# Patient Record
Sex: Female | Born: 1955 | Race: White | Hispanic: No | Marital: Married | Smoking: Never smoker
Health system: Southern US, Community
[De-identification: ages and names within clinical notes are randomized; demographics above are authoritative.]

## PROBLEM LIST (undated history)

## (undated) DIAGNOSIS — G43909 Migraine, unspecified, not intractable, without status migrainosus: Secondary | ICD-10-CM

## (undated) DIAGNOSIS — I10 Essential (primary) hypertension: Secondary | ICD-10-CM

## (undated) DIAGNOSIS — M549 Dorsalgia, unspecified: Secondary | ICD-10-CM

## (undated) HISTORY — PX: KNEE SURGERY: SHX244

---

## 2008-12-05 ENCOUNTER — Encounter: Admission: RE | Admit: 2008-12-05 | Discharge: 2009-01-23 | Payer: Self-pay | Admitting: Orthopedic Surgery

## 2010-08-17 ENCOUNTER — Ambulatory Visit: Payer: Self-pay | Admitting: Interventional Radiology

## 2010-08-17 ENCOUNTER — Emergency Department (HOSPITAL_BASED_OUTPATIENT_CLINIC_OR_DEPARTMENT_OTHER): Admission: EM | Admit: 2010-08-17 | Discharge: 2010-08-17 | Payer: Self-pay | Admitting: Emergency Medicine

## 2010-08-29 ENCOUNTER — Encounter
Admission: RE | Admit: 2010-08-29 | Discharge: 2010-08-29 | Payer: Self-pay | Source: Home / Self Care | Admitting: Orthopedic Surgery

## 2010-08-30 ENCOUNTER — Encounter
Admission: RE | Admit: 2010-08-30 | Discharge: 2010-09-20 | Payer: Self-pay | Source: Home / Self Care | Attending: Orthopedic Surgery | Admitting: Orthopedic Surgery

## 2010-09-26 ENCOUNTER — Encounter
Admission: RE | Admit: 2010-09-26 | Discharge: 2010-10-19 | Payer: Self-pay | Source: Home / Self Care | Attending: Orthopedic Surgery | Admitting: Orthopedic Surgery

## 2010-10-17 ENCOUNTER — Encounter: Admit: 2010-10-17 | Payer: Self-pay | Admitting: Orthopedic Surgery

## 2010-10-24 ENCOUNTER — Ambulatory Visit: Payer: 59 | Attending: Orthopedic Surgery | Admitting: Physical Therapy

## 2010-10-24 DIAGNOSIS — M545 Low back pain, unspecified: Secondary | ICD-10-CM | POA: Insufficient documentation

## 2010-10-24 DIAGNOSIS — M2569 Stiffness of other specified joint, not elsewhere classified: Secondary | ICD-10-CM | POA: Insufficient documentation

## 2010-10-24 DIAGNOSIS — IMO0001 Reserved for inherently not codable concepts without codable children: Secondary | ICD-10-CM | POA: Insufficient documentation

## 2010-10-31 ENCOUNTER — Ambulatory Visit: Payer: 59 | Admitting: Rehabilitation

## 2010-11-02 ENCOUNTER — Ambulatory Visit: Payer: 59 | Admitting: Rehabilitation

## 2010-11-07 ENCOUNTER — Ambulatory Visit: Payer: 59 | Admitting: Rehabilitation

## 2010-11-14 ENCOUNTER — Ambulatory Visit: Payer: 59 | Admitting: Rehabilitation

## 2010-11-16 ENCOUNTER — Ambulatory Visit: Payer: 59 | Admitting: Rehabilitation

## 2010-11-21 ENCOUNTER — Ambulatory Visit: Payer: 59 | Admitting: Rehabilitation

## 2010-11-28 ENCOUNTER — Ambulatory Visit: Payer: 59 | Attending: Orthopedic Surgery | Admitting: Physical Therapy

## 2010-11-28 DIAGNOSIS — M545 Low back pain, unspecified: Secondary | ICD-10-CM | POA: Insufficient documentation

## 2010-11-28 DIAGNOSIS — IMO0001 Reserved for inherently not codable concepts without codable children: Secondary | ICD-10-CM | POA: Insufficient documentation

## 2010-11-28 DIAGNOSIS — M2569 Stiffness of other specified joint, not elsewhere classified: Secondary | ICD-10-CM | POA: Insufficient documentation

## 2010-11-30 ENCOUNTER — Ambulatory Visit: Payer: 59 | Admitting: Physical Therapy

## 2010-12-05 ENCOUNTER — Ambulatory Visit: Payer: 59 | Admitting: Rehabilitation

## 2010-12-07 ENCOUNTER — Ambulatory Visit: Payer: 59 | Admitting: Physical Therapy

## 2010-12-12 ENCOUNTER — Ambulatory Visit: Payer: 59

## 2010-12-14 ENCOUNTER — Ambulatory Visit: Payer: 59 | Admitting: Physical Therapy

## 2010-12-19 ENCOUNTER — Ambulatory Visit: Payer: 59 | Admitting: Physical Therapy

## 2010-12-21 ENCOUNTER — Ambulatory Visit: Payer: 59 | Admitting: Physical Therapy

## 2010-12-26 ENCOUNTER — Ambulatory Visit: Payer: 59 | Attending: Orthopedic Surgery | Admitting: Rehabilitation

## 2010-12-26 DIAGNOSIS — M2569 Stiffness of other specified joint, not elsewhere classified: Secondary | ICD-10-CM | POA: Insufficient documentation

## 2010-12-26 DIAGNOSIS — M545 Low back pain, unspecified: Secondary | ICD-10-CM | POA: Insufficient documentation

## 2010-12-26 DIAGNOSIS — IMO0001 Reserved for inherently not codable concepts without codable children: Secondary | ICD-10-CM | POA: Insufficient documentation

## 2011-01-02 ENCOUNTER — Encounter: Payer: 59 | Admitting: Rehabilitation

## 2011-01-04 ENCOUNTER — Ambulatory Visit: Payer: 59 | Admitting: Rehabilitation

## 2011-01-09 ENCOUNTER — Ambulatory Visit: Payer: 59 | Admitting: Rehabilitation

## 2011-01-11 ENCOUNTER — Ambulatory Visit: Payer: 59 | Admitting: Rehabilitation

## 2011-01-16 ENCOUNTER — Encounter: Payer: 59 | Admitting: Rehabilitation

## 2011-01-18 ENCOUNTER — Encounter: Payer: 59 | Admitting: Rehabilitation

## 2015-05-27 ENCOUNTER — Encounter (HOSPITAL_BASED_OUTPATIENT_CLINIC_OR_DEPARTMENT_OTHER): Payer: Self-pay | Admitting: Emergency Medicine

## 2015-05-27 ENCOUNTER — Emergency Department (HOSPITAL_BASED_OUTPATIENT_CLINIC_OR_DEPARTMENT_OTHER): Payer: Managed Care, Other (non HMO)

## 2015-05-27 ENCOUNTER — Emergency Department (HOSPITAL_BASED_OUTPATIENT_CLINIC_OR_DEPARTMENT_OTHER)
Admission: EM | Admit: 2015-05-27 | Discharge: 2015-05-27 | Disposition: A | Discharge status: Home / Self Care | Payer: Managed Care, Other (non HMO) | Attending: Emergency Medicine | Admitting: Emergency Medicine

## 2015-05-27 DIAGNOSIS — Z79899 Other long term (current) drug therapy: Secondary | ICD-10-CM | POA: Insufficient documentation

## 2015-05-27 DIAGNOSIS — Y929 Unspecified place or not applicable: Secondary | ICD-10-CM | POA: Diagnosis not present

## 2015-05-27 DIAGNOSIS — Z23 Encounter for immunization: Secondary | ICD-10-CM | POA: Diagnosis not present

## 2015-05-27 DIAGNOSIS — Y998 Other external cause status: Secondary | ICD-10-CM | POA: Diagnosis not present

## 2015-05-27 DIAGNOSIS — I10 Essential (primary) hypertension: Secondary | ICD-10-CM | POA: Insufficient documentation

## 2015-05-27 DIAGNOSIS — Y9389 Activity, other specified: Secondary | ICD-10-CM | POA: Insufficient documentation

## 2015-05-27 DIAGNOSIS — W228XXA Striking against or struck by other objects, initial encounter: Secondary | ICD-10-CM | POA: Insufficient documentation

## 2015-05-27 DIAGNOSIS — S91332A Puncture wound without foreign body, left foot, initial encounter: Secondary | ICD-10-CM | POA: Insufficient documentation

## 2015-05-27 DIAGNOSIS — T148XXA Other injury of unspecified body region, initial encounter: Secondary | ICD-10-CM

## 2015-05-27 HISTORY — DX: Dorsalgia, unspecified: M54.9

## 2015-05-27 HISTORY — DX: Migraine, unspecified, not intractable, without status migrainosus: G43.909

## 2015-05-27 HISTORY — DX: Essential (primary) hypertension: I10

## 2015-05-27 MED ORDER — LIDOCAINE-EPINEPHRINE 1 %-1:100000 IJ SOLN
10.0000 mL | Freq: Once | INTRAMUSCULAR | Status: AC
  Administered 2015-05-27: 10 mL
  Filled 2015-05-27: qty 1

## 2015-05-27 MED ORDER — SULFAMETHOXAZOLE-TRIMETHOPRIM 800-160 MG PO TABS: 1.0000 | ORAL_TABLET | Freq: Two times a day (BID) | ORAL | Status: AC

## 2015-05-27 MED ORDER — NAPROXEN 500 MG PO TABS: 500.0000 mg | ORAL_TABLET | Freq: Two times a day (BID) | ORAL | Status: AC

## 2015-05-27 MED ORDER — HYDROCODONE-ACETAMINOPHEN 5-325 MG PO TABS
2.0000 | ORAL_TABLET | Freq: Once | ORAL | Status: AC
  Administered 2015-05-27: 2 via ORAL
  Filled 2015-05-27: qty 2

## 2015-05-27 MED ORDER — TETANUS-DIPHTH-ACELL PERTUSSIS 5-2.5-18.5 LF-MCG/0.5 IM SUSP
0.5000 mL | Freq: Once | INTRAMUSCULAR | Status: AC
  Administered 2015-05-27: 0.5 mL via INTRAMUSCULAR
  Filled 2015-05-27: qty 0.5

## 2015-05-27 MED ORDER — BACITRACIN ZINC 500 UNIT/GM EX OINT
1.0000 "application " | TOPICAL_OINTMENT | Freq: Two times a day (BID) | CUTANEOUS | Status: DC
  Administered 2015-05-27: 1 via TOPICAL
  Filled 2015-05-27: qty 28.35

## 2015-05-27 NOTE — ED Provider Notes (Signed)
CSN: [id number]     Arrival date & time 05/27/15  1906 History  This chart was scribed for Eber Hong, MD by Doreatha Martin, ED Scribe. This patient was seen in room MH04/MH04 and the patient's care was started at 7:23 PM.     Chief Complaint  Patient presents with  . Puncture Wound   The history is provided by the patient. No language interpreter was used.    HPI Comments: Olivia Solomon is a 59 y.o. female who presents to the Emergency Department complaining of a puncture wound to the plantar aspect of the left foot with controlled bleeding that occurred tonight after stepping on a freshly cut stump of a small tree. Last tetanus>5 yrs ago. She denies numbness, foreign body, fall, LOC, head injury.     Past Medical History  Diagnosis Date  . Back pain   . Migraine   . Hypertension    Past Surgical History  Procedure Laterality Date  . Knee surgery     History reviewed. No pertinent family history. Social History  Substance Use Topics  . Smoking status: Never Smoker   . Smokeless tobacco: None  . Alcohol Use: No   OB History    No data available     Review of Systems  Skin: Positive for wound.  Neurological: Negative for numbness.   Allergies  Codeine and Lomotil  Home Medications   Prior to Admission medications   Medication Sig Start Date End Date Taking? Authorizing Provider  atenolol (TENORMIN) 25 MG tablet Take by mouth daily.   Yes Historical Provider, MD  lisinopril (PRINIVIL,ZESTRIL) 10 MG tablet Take 10 mg by mouth daily.   Yes Historical Provider, MD  topiramate (TOPAMAX) 100 MG tablet Take 100 mg by mouth 2 (two) times daily.   Yes Historical Provider, MD  valACYclovir (VALTREX) 500 MG tablet Take 500 mg by mouth 2 (two) times daily.   Yes Historical Provider, MD  naproxen (NAPROSYN) 500 MG tablet Take 1 tablet (500 mg total) by mouth 2 (two) times daily with a meal. 05/27/15   Eber Hong, MD  sulfamethoxazole-trimethoprim (BACTRIM DS,SEPTRA DS) 800-160 MG per  tablet Take 1 tablet by mouth 2 (two) times daily. 05/27/15 06/03/15  Eber Hong, MD   BP 119/68 mmHg  Pulse 90  Temp(Src) 98.6 F (37 C) (Oral)  Resp 20  Ht 5\' 2"  (1.575 m)  Wt 184 lb (83.462 kg)  BMI 33.65 kg/m2  SpO2 100% Physical Exam  Constitutional: She appears well-developed and well-nourished.  HENT:  Head: Normocephalic and atraumatic.  Eyes: Conjunctivae are normal. Right eye exhibits no discharge. Left eye exhibits no discharge.  Pulmonary/Chest: Effort normal. No respiratory distress.  Neurological: She is alert. Coordination normal.  Skin: Skin is warm and dry. No rash noted. She is not diaphoretic. No erythema.  Puncture to the mid-left ball of the foot 0.5 cm in diameter   Psychiatric: She has a normal mood and affect.  Nursing note and vitals reviewed.   ED Course  Procedures (including critical care time) DIAGNOSTIC STUDIES: Oxygen Saturation is 100% on RA, normal by my interpretation.    COORDINATION OF CARE: 7:24 PM Discussed treatment plan with pt at bedside and pt agreed to plan.   Labs Review Labs Reviewed - No data to display  Imaging Review Dg Foot Complete Left  05/27/2015   CLINICAL DATA:  Stepped on tree branch, with puncture wound at the plantar aspect of the left foot. Initial encounter.  EXAM: LEFT FOOT -  COMPLETE 3+ VIEW  COMPARISON:  None.  FINDINGS: There is no evidence of fracture or dislocation. The joint spaces are preserved. There is no evidence of talar subluxation; the subtalar joint is unremarkable in appearance. A bipartite medial sesamoid of the first toe is noted. A plantar calcaneal spur is noted. An os trigonum is seen.  Minimal soft tissue air is seen projecting just lateral to the distal first metatarsal. No radiopaque foreign bodies are seen.  IMPRESSION: 1. No evidence of fracture or dislocation. 2. Bipartite medial sesamoid of the first toe. 3. Os trigonum noted. 4. Minimal soft tissue air noted projecting just lateral to the distal  first metatarsal. No radiopaque foreign bodies seen, though wood splinters tend to be radiolucent on radiograph, and may not be well characterized on this study.   Electronically Signed   By: Roanna Raider M.D.   On: 05/27/2015 20:31   I have personally reviewed and evaluated these images and lab results as part of my medical decision-making.    MDM   Final diagnoses:  Puncture wound    Foot anesthetized with local N aesthetic, patient tolerated irrigation well. X-ray shows no foreign bodies, no visualized foreign bodies on wound exploration. Irrigated copiously, bacitracin, sterile dressing, antibiotics prescribed for prophylaxis. Patient in agreement with plan. Updated tetanus.  Meds given in ED:  Medications  bacitracin ointment 1 application (not administered)  Tdap (BOOSTRIX) injection 0.5 mL (0.5 mLs Intramuscular Given 05/27/15 1944)  lidocaine-EPINEPHrine (XYLOCAINE W/EPI) 1 %-1:100000 (with pres) injection 10 mL (10 mLs Other Given by Other 05/27/15 1945)    New Prescriptions   NAPROXEN (NAPROSYN) 500 MG TABLET    Take 1 tablet (500 mg total) by mouth 2 (two) times daily with a meal.   SULFAMETHOXAZOLE-TRIMETHOPRIM (BACTRIM DS,SEPTRA DS) 800-160 MG PER TABLET    Take 1 tablet by mouth 2 (two) times daily.    I personally performed the services described in this documentation, which was scribed in my presence. The recorded information has been reviewed and is accurate.  Meds at this   Eber Hong, MD 05/27/15 2136

## 2015-05-27 NOTE — ED Notes (Signed)
Patient reports that she stepped up on a new bush, and has a puncture wound to her left foot.

## 2015-05-27 NOTE — ED Notes (Signed)
I have patient soaking her foot in saline/iodine solution.

## 2015-05-27 NOTE — Discharge Instructions (Signed)
Keep the wound clean and dry, use antibiotics topical cream, see her doctor in one week for recheck. Return for severe pain redness swelling or pus or fever.  Please obtain all of your results from medical records or have your doctors office obtain the results - share them with your doctor - you should be seen at your doctors office in the next 2 days. Call today to arrange your follow up. Take the medications as prescribed. Please review all of the medicines and only take them if you do not have an allergy to them. Please be aware that if you are taking birth control pills, taking other prescriptions, ESPECIALLY ANTIBIOTICS may make the birth control ineffective - if this is the case, either do not engage in sexual activity or use alternative methods of birth control such as condoms until you have finished the medicine and your family doctor says it is OK to restart them. If you are on a blood thinner such as COUMADIN, be aware that any other medicine that you take may cause the coumadin to either work too much, or not enough - you should have your coumadin level rechecked in next 7 days if this is the case.  ?  It is also a possibility that you have an allergic reaction to any of the medicines that you have been prescribed - Everybody reacts differently to medications and while MOST people have no trouble with most medicines, you may have a reaction such as nausea, vomiting, rash, swelling, shortness of breath. If this is the case, please stop taking the medicine immediately and contact your physician.  ?  You should return to the ER if you develop severe or worsening symptoms.

## 2016-06-04 IMAGING — DX DG FOOT COMPLETE 3+V*L*
3 series · 3 of 3 positions shown · non-contrast
Comparison: None.

CLINICAL DATA: Stepped on tree branch, with puncture wound at the
plantar aspect of the left foot. Initial encounter.

EXAM:
LEFT FOOT - COMPLETE 3+ VIEW

[foot ap]
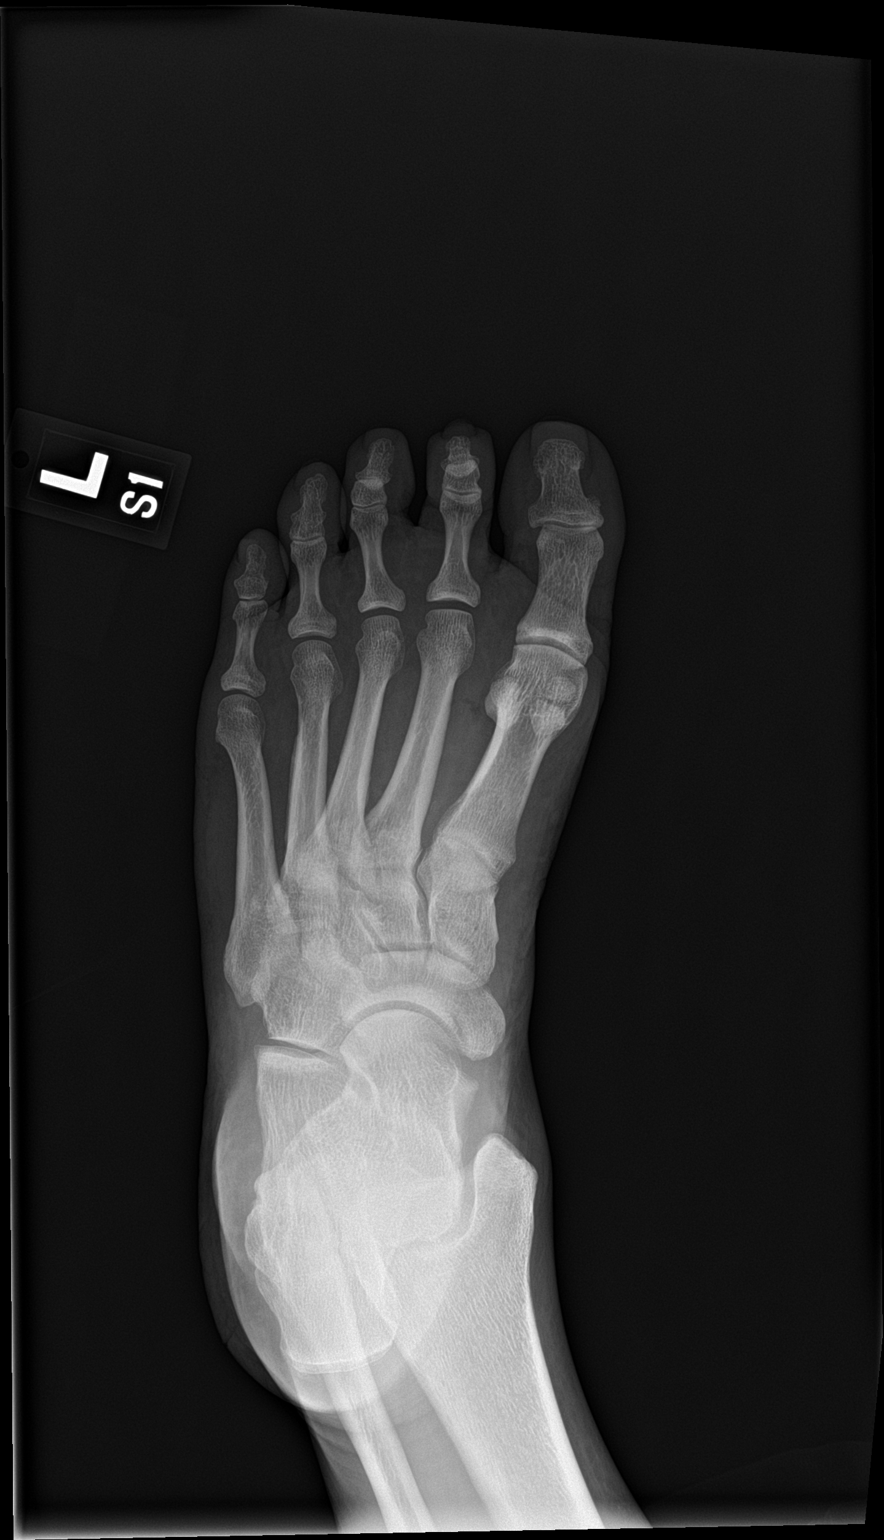

[foot obl]
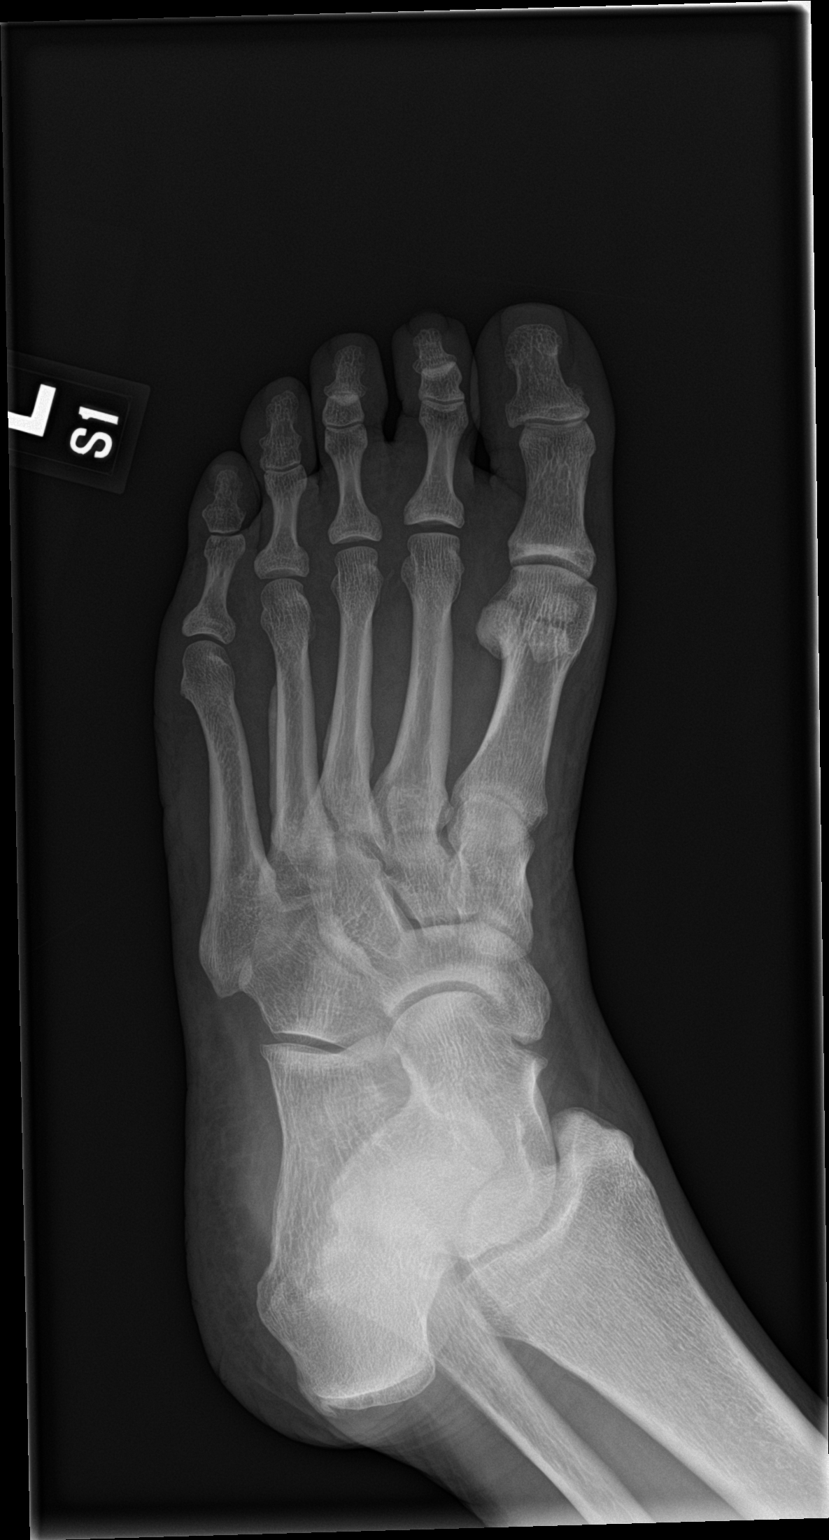

[foot lat]
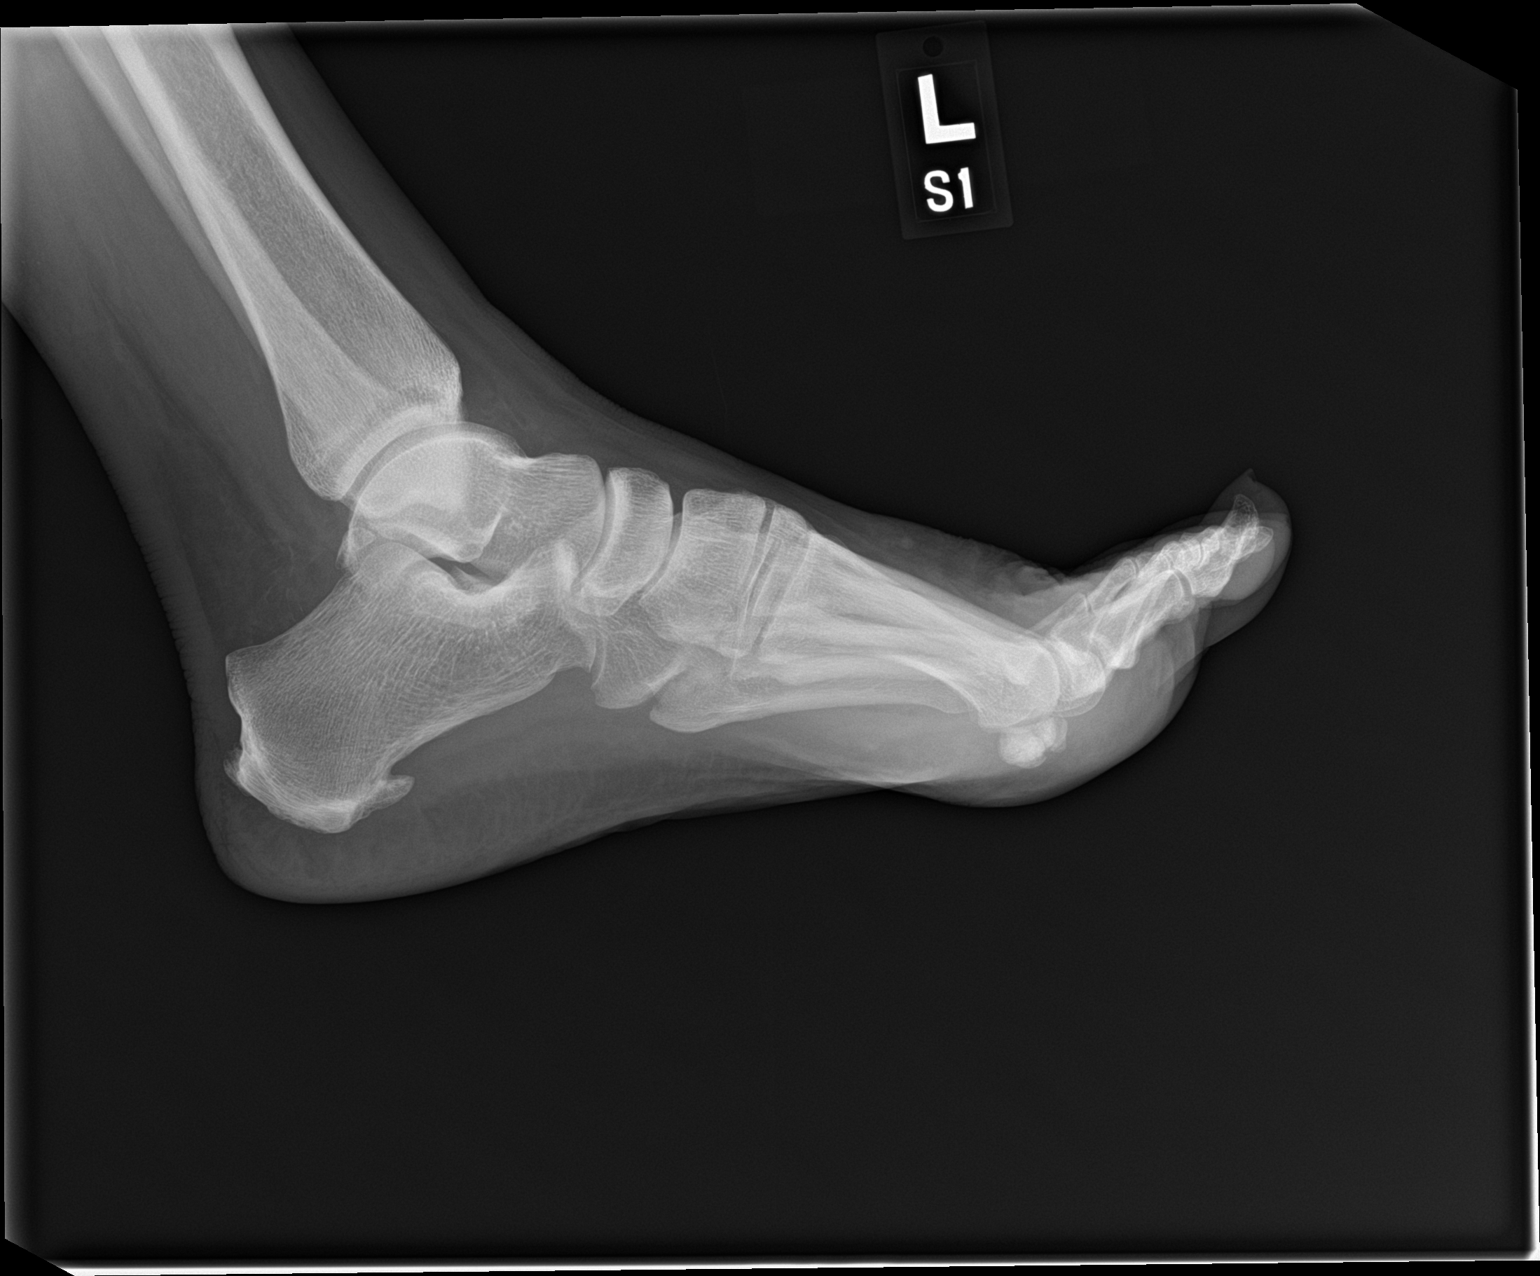

[3 of 3 positions shown; findings below may reference images not displayed]

FINDINGS: There is no evidence of fracture or dislocation. The joint spaces
are preserved. There is no evidence of talar subluxation; the
subtalar joint is unremarkable in appearance. A bipartite medial
sesamoid of the first toe is noted. A plantar calcaneal spur is
noted. An os trigonum is seen.

Minimal soft tissue air is seen projecting just lateral to the
distal first metatarsal. No radiopaque foreign bodies are seen.
IMPRESSION: 1. No evidence of fracture or dislocation.
2. Bipartite medial sesamoid of the first toe.
3. Os trigonum noted.
4. Minimal soft tissue air noted projecting just lateral to the
distal first metatarsal. No radiopaque foreign bodies seen, though
Jim splinters tend to be radiolucent on radiograph, and may not be
well characterized on this study.
